# Patient Record
Sex: Female | Born: 1990
Health system: Southern US, Community
[De-identification: ages and names within clinical notes are randomized; demographics above are authoritative.]

## PROBLEM LIST (undated history)

## (undated) DIAGNOSIS — N390 Urinary tract infection, site not specified: Secondary | ICD-10-CM

## (undated) HISTORY — PX: NO PAST SURGERIES: SHX2092

---

## 2011-07-12 NOTE — L&D Delivery Note (Signed)
Delivery Note At 9:08 AM a viable female, "Danielle Cantu", was delivered via Vaginal, Spontaneous Delivery (Presentation: Right Occiput Anterior).  APGAR: 8, 9; weight .   Placenta status: Intact, Spontaneous.  Cord: 3 vessels with the following complications: None.  Cord pH: NA Presented to MAU completely dilated, s/p SROM at 3:30am, clear fluid. Started bleeding at 8am, called office, was instructed to come to MAU.    Anesthesia: Local  Episiotomy: None Lacerations: 2nd degree Suture Repair: 3.0 vicryl Est. Blood Loss (mL): 200 cc  Mom to postpartum.  Baby to nursery-stable.  Nigel Bridgeman 10/11/2011, 9:57 AM

## 2011-09-09 ENCOUNTER — Encounter (INDEPENDENT_AMBULATORY_CARE_PROVIDER_SITE_OTHER): Payer: Self-pay | Admitting: Obstetrics and Gynecology

## 2011-09-09 DIAGNOSIS — Z3480 Encounter for supervision of other normal pregnancy, unspecified trimester: Secondary | ICD-10-CM

## 2011-09-13 ENCOUNTER — Other Ambulatory Visit (INDEPENDENT_AMBULATORY_CARE_PROVIDER_SITE_OTHER): Payer: Self-pay

## 2011-09-13 ENCOUNTER — Encounter: Payer: Self-pay | Admitting: Obstetrics and Gynecology

## 2011-09-13 DIAGNOSIS — O0930 Supervision of pregnancy with insufficient antenatal care, unspecified trimester: Secondary | ICD-10-CM

## 2011-09-19 LAB — ABO/RH

## 2011-09-19 LAB — STREP B DNA PROBE: GBS: NEGATIVE

## 2011-09-19 LAB — RUBELLA ANTIBODY, IGM: Rubella: IMMUNE

## 2011-09-22 ENCOUNTER — Encounter (INDEPENDENT_AMBULATORY_CARE_PROVIDER_SITE_OTHER): Payer: Self-pay | Admitting: Obstetrics and Gynecology

## 2011-09-22 DIAGNOSIS — Z331 Pregnant state, incidental: Secondary | ICD-10-CM

## 2011-09-23 ENCOUNTER — Encounter: Payer: Self-pay | Admitting: Obstetrics and Gynecology

## 2011-09-30 ENCOUNTER — Encounter (INDEPENDENT_AMBULATORY_CARE_PROVIDER_SITE_OTHER): Payer: Self-pay | Admitting: Obstetrics and Gynecology

## 2011-09-30 DIAGNOSIS — Z331 Pregnant state, incidental: Secondary | ICD-10-CM

## 2011-10-05 ENCOUNTER — Encounter (INDEPENDENT_AMBULATORY_CARE_PROVIDER_SITE_OTHER): Payer: Self-pay | Admitting: Obstetrics and Gynecology

## 2011-10-05 DIAGNOSIS — Z331 Pregnant state, incidental: Secondary | ICD-10-CM

## 2011-10-11 ENCOUNTER — Encounter (HOSPITAL_COMMUNITY): Payer: Self-pay | Admitting: *Deleted

## 2011-10-11 ENCOUNTER — Inpatient Hospital Stay (HOSPITAL_COMMUNITY)
Admission: AD | Admit: 2011-10-11 | Discharge: 2011-10-13 | DRG: 373 | Disposition: A | Discharge status: Home / Self Care | Payer: BC Managed Care – PPO | Source: Ambulatory Visit | Attending: Obstetrics and Gynecology | Admitting: Obstetrics and Gynecology

## 2011-10-11 ENCOUNTER — Encounter: Payer: Self-pay | Admitting: Obstetrics and Gynecology

## 2011-10-11 DIAGNOSIS — O09519 Supervision of elderly primigravida, unspecified trimester: Secondary | ICD-10-CM | POA: Diagnosis present

## 2011-10-11 HISTORY — DX: Urinary tract infection, site not specified: N39.0

## 2011-10-11 LAB — CBC
HCT: 38.7 % (ref 36.0–46.0)
MCHC: 34.1 g/dL (ref 30.0–36.0)
MCV: 91.3 fL (ref 78.0–100.0)
Platelets: 191 10*3/uL (ref 150–400)
RDW: 13.8 % (ref 11.5–15.5)

## 2011-10-11 LAB — TYPE AND SCREEN: ABO/RH(D): O POS

## 2011-10-11 MED ORDER — LANOLIN HYDROUS EX OINT: TOPICAL_OINTMENT | CUTANEOUS | Status: DC | PRN

## 2011-10-11 MED ORDER — ONDANSETRON HCL 4 MG PO TABS: 4.0000 mg | ORAL_TABLET | ORAL | Status: DC | PRN

## 2011-10-11 MED ORDER — DIBUCAINE 1 % RE OINT: 1.0000 "application " | TOPICAL_OINTMENT | RECTAL | Status: DC | PRN

## 2011-10-11 MED ORDER — IBUPROFEN 600 MG PO TABS
600.0000 mg | ORAL_TABLET | Freq: Four times a day (QID) | ORAL | Status: DC
  Administered 2011-10-11 – 2011-10-13 (×8): 600 mg via ORAL
  Filled 2011-10-11 (×8): qty 1

## 2011-10-11 MED ORDER — DIPHENHYDRAMINE HCL 25 MG PO CAPS: 25.0000 mg | ORAL_CAPSULE | Freq: Four times a day (QID) | ORAL | Status: DC | PRN

## 2011-10-11 MED ORDER — PRENATAL MULTIVITAMIN CH
1.0000 | ORAL_TABLET | Freq: Every day | ORAL | Status: DC
  Administered 2011-10-12 – 2011-10-13 (×2): 1 via ORAL
  Filled 2011-10-11 (×2): qty 1

## 2011-10-11 MED ORDER — LIDOCAINE HCL (PF) 1 % IJ SOLN
INTRAMUSCULAR | Status: AC
  Administered 2011-10-11: 30 mL
  Filled 2011-10-11: qty 30

## 2011-10-11 MED ORDER — BENZOCAINE-MENTHOL 20-0.5 % EX AERO
INHALATION_SPRAY | CUTANEOUS | Status: AC
  Filled 2011-10-11: qty 56

## 2011-10-11 MED ORDER — ONDANSETRON HCL 4 MG/2ML IJ SOLN: 4.0000 mg | INTRAMUSCULAR | Status: DC | PRN

## 2011-10-11 MED ORDER — LACTATED RINGERS IV SOLN
INTRAVENOUS | Status: DC
  Administered 2011-10-11: 09:00:00 via INTRAVENOUS

## 2011-10-11 MED ORDER — ZOLPIDEM TARTRATE 5 MG PO TABS: 5.0000 mg | ORAL_TABLET | Freq: Every evening | ORAL | Status: DC | PRN

## 2011-10-11 MED ORDER — SENNOSIDES-DOCUSATE SODIUM 8.6-50 MG PO TABS
2.0000 | ORAL_TABLET | Freq: Every day | ORAL | Status: DC
  Administered 2011-10-12: 2 via ORAL

## 2011-10-11 MED ORDER — OXYCODONE-ACETAMINOPHEN 5-325 MG PO TABS
1.0000 | ORAL_TABLET | ORAL | Status: DC | PRN
  Administered 2011-10-11 – 2011-10-12 (×2): 1 via ORAL
  Filled 2011-10-11 (×2): qty 1

## 2011-10-11 MED ORDER — OXYTOCIN 20 UNITS IN LACTATED RINGERS INFUSION - SIMPLE
INTRAVENOUS | Status: AC
  Administered 2011-10-11: 20 [IU]
  Filled 2011-10-11: qty 1000

## 2011-10-11 MED ORDER — BENZOCAINE-MENTHOL 20-0.5 % EX AERO: 1.0000 "application " | INHALATION_SPRAY | CUTANEOUS | Status: DC | PRN

## 2011-10-11 MED ORDER — TETANUS-DIPHTH-ACELL PERTUSSIS 5-2.5-18.5 LF-MCG/0.5 IM SUSP
0.5000 mL | Freq: Once | INTRAMUSCULAR | Status: AC
  Administered 2011-10-12: 0.5 mL via INTRAMUSCULAR
  Filled 2011-10-11: qty 0.5

## 2011-10-11 MED ORDER — SIMETHICONE 80 MG PO CHEW: 80.0000 mg | CHEWABLE_TABLET | ORAL | Status: DC | PRN

## 2011-10-11 MED ORDER — ERYTHROMYCIN 5 MG/GM OP OINT
TOPICAL_OINTMENT | OPHTHALMIC | Status: AC
  Filled 2011-10-11: qty 1

## 2011-10-11 MED ORDER — WITCH HAZEL-GLYCERIN EX PADS: 1.0000 "application " | MEDICATED_PAD | CUTANEOUS | Status: DC | PRN

## 2011-10-11 NOTE — H&P (Signed)
Danielle Cantu is a 21 y.o. female, G1P0 at 36 weeks presenting with report of SROM at 3:30am, onset of contractions soon after, and onset of vaginal bleeding at approx 8:15 am.  Patient's husband called the office and was directed to bring the patient here.  She was noted to be completely dilated on arrival in MAU and delivered soon after arrival.  Pregnancy remarkable for: Late to care, with onset of care at [facility] at 68 weeks--unsure degree of care in the [place] before transfer to [facility] Advanced paternal age  History of present pregnancy: Patient entered care at 34 weeks at [facility], with unclear amount of care before that when patient was in the [place].  EDC of 10/18/11 was established by LMP.  Her first ultrasound was done at 35-36 week, with normal findings.  Her prenatal course prior to her arrival in the [place] was essentially uncomplicated per her report.  Her NOB labs were done at her first visit at [facility] on 09/09/11.  No significant risk issues were identified after her arrival, other than being late to care.  History OB History    Grav Para Term Preterm Abortions TAB SAB Ect Mult Living   1 1 0 0 0 0 0 0 0 1      Past Medical History  Diagnosis Date  . Urinary tract infection    Past Surgical History  Procedure Date  . No past surgeries    Family History: family history is negative for Anesthesia problems. Social History:  reports that she has never smoked. She has never used smokeless tobacco. She reports that she does not drink alcohol or use illicit drugs. FOB is involved and supportive.  Patient is [place], does speak [place].  ROS:  Contractions, vaginal bleeding, leaking of clear fluid.  Dilation: 10 Exam by:: Emilee Hero. Blood pressure 98/64, pulse 91, temperature 98.3 F (36.8 C), temperature source Oral, resp. rate 18, SpO2 98.00%, unknown if currently breastfeeding.  Exam Physical Exam  Chest clear Heart RRR without murmur Abd gravid, NT between  contractions Pelvic--see above, vtx at +2 station, moderate vaginal bleeding, consistent with bloody show FHR reassuring, mild/moderate variables with pushes UC q 3 minutes, strong.  Prenatal labs: ABO, Rh: --/--/O POS, O POS (04/02 0855) Antibody: NEG (04/02 0855) Rubella:  Immune RPR: NON REACTIVE (04/02 0855)  HBsAg:   Negative HIV:   NG GBS:   Negative Sickle cell negative Cultures negative at 35-36 weeks  Assessment/Plan: IUP at 39 weeks Second stage labor, delivery imminent  Will deliver in MAU.  Danielle Cantu 10/11/2011, 10am

## 2011-10-11 NOTE — MAU Note (Signed)
asst to rm via wc.  Reports srom at 0330. Bleeding started 0800.  Bright red blood noted on towel.  Constant pain , lower abd and low back

## 2011-10-12 LAB — CBC
HCT: 33.1 % — ABNORMAL LOW (ref 36.0–46.0)
Hemoglobin: 11 g/dL — ABNORMAL LOW (ref 12.0–15.0)
MCH: 30.6 pg (ref 26.0–34.0)
MCHC: 33.2 g/dL (ref 30.0–36.0)
MCV: 92.2 fL (ref 78.0–100.0)

## 2011-10-12 NOTE — Progress Notes (Signed)
Post Partum Day 1 Subjective: no complaints, up ad lib, voiding, tolerating PO and breastfeeding.  Pt undecided about birth control PP.  Objective: Blood pressure 100/64, pulse 97, temperature 98 F (36.7 C), temperature source Oral, resp. rate 19, last menstrual period 01/11/2011, SpO2 98.00%, unknown if currently breastfeeding.  Physical Exam:  General: alert and no distress Lochia: appropriate Uterine Fundus: firm, NT Incision: n/a DVT Evaluation: No evidence of DVT seen on physical exam. No cords or calf tenderness. No significant calf/ankle edema.   Basename 10/12/11 0600 10/11/11 0855  HGB 11.0* 13.2  HCT 33.1* 38.7    Assessment/Plan: Plan for discharge tomorrow, Breastfeeding, Circumcision prior to discharge (done 10/11/11) and Contraception undecided. Continue Current Care.   LOS: 1 day   Shauntel Prest Y 10/12/2011, 5:43 PM

## 2011-10-12 NOTE — Progress Notes (Signed)
Clinical Social Work Department PSYCHOSOCIAL ASSESSMENT - MATERNAL/CHILD 10/12/2011  Patient:  Danielle Cantu,Danielle Cantu  Account Number:  [id number]  Admit Date:  10/11/2011  Childs Name:   Danielle Cantu    Clinical Social Worker:  Nikeya Maxim, LCSWA   Date/Time:  10/12/2011 11:00 AM  Date Referred:  10/12/2011   Referral source  CN     Referred reason  LPNC   Other referral source:    I:  FAMILY / HOME ENVIRONMENT Child's legal guardian:  PARENT  Guardian - Name Guardian - Age Guardian - Address  Danielle Cantu 20 [address] [place], [place] [address]  Danielle Cantu 52 (same as above)   Other household support members/support persons Other support:    II  PSYCHOSOCIAL DATA Information Source:  Patient Interview  Financial and Community Resources Employment:   Financial resources:  Private Insurance If Medicaid - County:    School / Grade:   Maternity Care Coordinator / Child Services Coordination / Early Interventions:  Cultural issues impacting care:    III  STRENGTHS Strengths  Adequate Resources  Home prepared for Child (including basic supplies)  Supportive family/friends   Strength comment:    IV  RISK FACTORS AND CURRENT PROBLEMS Current Problem:  None   Risk Factor & Current Problem Patient Issue Family Issue Risk Factor / Current Problem Comment   N N LPNC    V  SOCIAL WORK ASSESSMENT Pt started PNC, Oct. 6 while in the [place].  She received regular PNC until she relocated to the area 08/16/11.  Pt then established care locally.  She denies any illegal substance use.  UDS is negative, meconium results are pending.  She lives with her husband, who she describes as supportive.  Pt is bonding well with the infant and appears appropriate.  Sw will follow up with drug screen results and make a referral if needed.      VI SOCIAL WORK PLAN Social Work Plan  No Further Intervention Required / No Barriers to Discharge   Type of pt/family education:   If child  protective services report - county:   If child protective services report - date:   Information/referral to community resources comment:   Other social work plan:    

## 2011-10-13 MED ORDER — IBUPROFEN 600 MG PO TABS: 600.0000 mg | ORAL_TABLET | Freq: Four times a day (QID) | ORAL | Status: AC

## 2011-10-13 MED ORDER — OXYCODONE-ACETAMINOPHEN 5-325 MG PO TABS: 1.0000 | ORAL_TABLET | ORAL | Status: AC | PRN

## 2011-10-13 MED ORDER — FERROUS SULFATE 325 (65 FE) MG PO TABS: 325.0000 mg | ORAL_TABLET | Freq: Three times a day (TID) | ORAL | Status: AC

## 2011-10-13 NOTE — Discharge Instructions (Signed)

## 2011-10-13 NOTE — Discharge Summary (Signed)
Physician Discharge Summary  Patient ID: Danielle Cantu MRN: [id number] DOB/AGE: 21-20-1992 20 y.o.  PrPregnancy remarkable for:  Late to care, with onset of care at [facility] at 26 weeks--unsure degree of care in the [place] before transfer to [facility]  Advanced paternal age oblem list:  Admit date: 10/11/2011 Discharge date: 10/13/2011  Admission Diagnoses: term pg, 2nd stage labor  Discharge Diagnoses: SVD, normal involution, 2 degree laceration,  Active Problems:  * No active hospital problems. *  vaginal delivery  Discharged Condition: stable  Hospital Course: normal involution Consults: None  Significant Diagnostic Studies: labs:  Hemoglobin & Hematocrit     Component Value Date/Time   HGB 11.0* 10/12/2011 0600   HCT 33.1* 10/12/2011 0600     Treatments: none  Discharge Exam: Blood pressure 93/61, pulse 64, temperature 97.9 F (36.6 C), temperature source Oral, resp. rate 18, last menstrual period 01/11/2011, SpO2 98.00%, unknown if currently breastfeeding. see Dr. Stefano Gaul progress note  Disposition: Final discharge disposition not confirmed  Discharge Orders    Future Orders Please Complete By Expires   Strep B DNA probe      Comments:   This external order was created through the Results Console.   HIV antibody      Comments:   This external order was created through the Results Console.   GC/chlamydia probe amp, genital      Comments:   This external order was created through the Results Console.   Hepatitis B surface antigen      Comments:   This external order was created through the Results Console.   Rubella antibody, IgM      Comments:   This external order was created through the Results Console.   ABO/Rh      Comments:   This external order was created through the Results Console.     Medication List  As of 10/13/2011 10:32 AM   TAKE these medications         ferrous sulfate 325 (65 FE) MG tablet   Take 1 tablet (325 mg total) by mouth 3 (three) times  daily with meals.      fish oil-omega-3 fatty acids 1000 MG capsule   Take 1 g by mouth daily.      ibuprofen 600 MG tablet   Commonly known as: ADVIL,MOTRIN   Take 1 tablet (600 mg total) by mouth every 6 (six) hours.      oxyCODONE-acetaminophen 5-325 MG per tablet   Commonly known as: PERCOCET   Take 1-2 tablets by mouth every 3 (three) hours as needed (moderate - severe pain).      prenatal multivitamin Tabs   Take 1 tablet by mouth daily.           Follow-up Information    Follow up with [facility] in 6 weeks.       discharge by Dr. Stefano Gaul, undecided regarding contraception.  SignedLavera Guise 10/13/2011, 10:32 AM

## 2011-10-13 NOTE — Progress Notes (Signed)
Post Partum Day 2 Subjective: no complaints, up ad lib, voiding and tolerating PO  Objective: Blood pressure 93/61, pulse 64, temperature 97.9 F (36.6 C), temperature source Oral, resp. rate 18, last menstrual period 01/11/2011, SpO2 98.00%, unknown if currently breastfeeding.  Physical Exam:  General: alert and cooperative Lochia: appropriate Uterine Fundus: firm Incision: NA DVT Evaluation: No evidence of DVT seen on physical exam. Negative Homan's sign.   Basename 10/12/11 0600 10/11/11 0855  HGB 11.0* 13.2  HCT 33.1* 38.7    Assessment/Plan: Discharge home, Breastfeeding and Contraception Undecided.   LOS: 2 days   Lennan Malone V 10/13/2011, 9:25 AM

## 2011-11-22 ENCOUNTER — Ambulatory Visit (INDEPENDENT_AMBULATORY_CARE_PROVIDER_SITE_OTHER): Payer: Self-pay | Admitting: Obstetrics and Gynecology

## 2011-11-22 ENCOUNTER — Encounter: Payer: Self-pay | Admitting: Obstetrics and Gynecology

## 2011-11-22 VITALS — BP 90/56 | Resp 12 | Wt 102.0 lb

## 2011-11-22 DIAGNOSIS — N898 Other specified noninflammatory disorders of vagina: Secondary | ICD-10-CM

## 2011-11-22 LAB — POCT WET PREP (WET MOUNT)

## 2011-11-22 MED ORDER — TERCONAZOLE 0.4 % VA CREA: 1.0000 | TOPICAL_CREAM | Freq: Every day | VAGINAL | Status: AC

## 2011-11-22 NOTE — Progress Notes (Signed)
S: comfortable      Bleeding stopped    breast feeding wants to stop but breasts hurt if not pumping  O VSS     abd soft, nontender     Diastasis recti finger breath     Normal hair distrubition mons pubis,      EGBUS and perineum WNL,  vagina  pink, moist normal rugae, good vaginal tone cerix LTC, no cervical motion tenderness, No adnexal masses or tenderness uterus firm small Wet prep neg clue, neg trich, +hyphae     A normal involution     Lactating     6 weeks PP VVC P f/o up annual and prn     No birth control plans, RX terazol discussed,baking soda bathes, info on gardasil given Lavera Guise, CNM

## 2011-11-22 NOTE — Progress Notes (Signed)
Date of delivery: 10/11/2011 Female Name: Domini Vaginal delivery:yes Cesarean section:no Tubal ligation:no GDM:no Breast Feeding:yes Bottle Feeding:yes b millk Post-Partum Blues:no Abnormal pap:no Normal GU function: yes Normal GI function:yes Returning to work:no  C/o L breast very painful & twice the size as the R breast  EPS 4 - husband completed form for pt

## 2011-11-23 ENCOUNTER — Other Ambulatory Visit: Payer: Self-pay | Admitting: Obstetrics and Gynecology

## 2011-11-23 NOTE — Telephone Encounter (Signed)
PT'S HUSBAND CALLED, STATES A RX WAS TO BE SENT TO PHARM FOR PT AND IT WAS NOT THERE, PER STEPHANIE, PHARMACIST @ [facility], THEY HAVE NOT GOTTEN A RX FOR THIS PT.  RX CALLED IN FOR TERAZOL 7, PT MADE AWARE.

## 2011-11-23 NOTE — Telephone Encounter (Signed)
Triage/epic/was seen by Judith Part

## 2012-03-21 ENCOUNTER — Encounter: Payer: Self-pay | Admitting: Obstetrics and Gynecology

## 2012-03-21 ENCOUNTER — Ambulatory Visit (INDEPENDENT_AMBULATORY_CARE_PROVIDER_SITE_OTHER): Payer: BC Managed Care – PPO | Admitting: Obstetrics and Gynecology

## 2012-03-21 VITALS — BP 100/62 | HR 68 | Resp 16 | Ht 60.0 in | Wt 97.0 lb

## 2012-03-21 DIAGNOSIS — Z124 Encounter for screening for malignant neoplasm of cervix: Secondary | ICD-10-CM

## 2012-03-21 DIAGNOSIS — Z23 Encounter for immunization: Secondary | ICD-10-CM

## 2012-03-21 MED ORDER — HPV QUADRIVALENT VACCINE IM SUSP
0.5000 mL | Freq: Once | INTRAMUSCULAR | Status: AC
  Administered 2012-03-21: 0.5 mL via INTRAMUSCULAR
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
## 2012-03-21 NOTE — Addendum Note (Signed)
Addended by: Stephens Shire on: 03/21/2012 04:09 PM   Modules accepted: Orders

## 2012-03-21 NOTE — Progress Notes (Addendum)
Gardasil Immunization was documented by [facility]

## 2012-03-21 NOTE — Addendum Note (Signed)
Addended by: Mathis Bud on: 03/21/2012 05:46 PM   Modules accepted: Orders

## 2012-03-22 LAB — PAP IG W/ RFLX HPV ASCU

## 2012-05-17 ENCOUNTER — Other Ambulatory Visit (INDEPENDENT_AMBULATORY_CARE_PROVIDER_SITE_OTHER): Payer: BC Managed Care – PPO

## 2012-05-17 DIAGNOSIS — Z23 Encounter for immunization: Secondary | ICD-10-CM

## 2012-05-17 MED ORDER — HPV QUADRIVALENT VACCINE IM SUSP
0.5000 mL | Freq: Once | INTRAMUSCULAR | Status: AC
  Administered 2012-05-17 – 2012-05-18 (×2): 0.5 mL via INTRAMUSCULAR

## 2012-08-29 ENCOUNTER — Other Ambulatory Visit: Payer: BC Managed Care – PPO

## 2012-08-29 DIAGNOSIS — Z23 Encounter for immunization: Secondary | ICD-10-CM

## 2014-05-12 ENCOUNTER — Encounter: Payer: Self-pay | Admitting: Obstetrics and Gynecology

## 2014-05-23 ENCOUNTER — Emergency Department (HOSPITAL_COMMUNITY): Payer: BC Managed Care – PPO

## 2014-05-23 ENCOUNTER — Emergency Department (HOSPITAL_COMMUNITY)
Admission: EM | Admit: 2014-05-23 | Discharge: 2014-05-23 | Disposition: A | Discharge status: Home / Self Care | Payer: BC Managed Care – PPO | Attending: Emergency Medicine | Admitting: Emergency Medicine

## 2014-05-23 ENCOUNTER — Encounter (HOSPITAL_COMMUNITY): Payer: Self-pay | Admitting: *Deleted

## 2014-05-23 DIAGNOSIS — Y9389 Activity, other specified: Secondary | ICD-10-CM | POA: Diagnosis not present

## 2014-05-23 DIAGNOSIS — Z87440 Personal history of urinary (tract) infections: Secondary | ICD-10-CM | POA: Insufficient documentation

## 2014-05-23 DIAGNOSIS — S199XXA Unspecified injury of neck, initial encounter: Secondary | ICD-10-CM | POA: Diagnosis not present

## 2014-05-23 DIAGNOSIS — Y9289 Other specified places as the place of occurrence of the external cause: Secondary | ICD-10-CM | POA: Diagnosis not present

## 2014-05-23 DIAGNOSIS — S0990XA Unspecified injury of head, initial encounter: Secondary | ICD-10-CM | POA: Insufficient documentation

## 2014-05-23 DIAGNOSIS — Z79899 Other long term (current) drug therapy: Secondary | ICD-10-CM | POA: Diagnosis not present

## 2014-05-23 DIAGNOSIS — S3992XA Unspecified injury of lower back, initial encounter: Secondary | ICD-10-CM | POA: Insufficient documentation

## 2014-05-23 DIAGNOSIS — T148XXA Other injury of unspecified body region, initial encounter: Secondary | ICD-10-CM

## 2014-05-23 DIAGNOSIS — S0081XA Abrasion of other part of head, initial encounter: Secondary | ICD-10-CM | POA: Diagnosis not present

## 2014-05-23 DIAGNOSIS — Y998 Other external cause status: Secondary | ICD-10-CM | POA: Insufficient documentation

## 2014-05-23 DIAGNOSIS — S0083XA Contusion of other part of head, initial encounter: Secondary | ICD-10-CM | POA: Insufficient documentation

## 2014-05-23 LAB — POC URINE PREG, ED: PREG TEST UR: NEGATIVE

## 2014-05-23 MED ORDER — HYDROCODONE-ACETAMINOPHEN 5-325 MG PO TABS
1.0000 | ORAL_TABLET | Freq: Once | ORAL | Status: AC
  Administered 2014-05-23: 1 via ORAL
  Filled 2014-05-23: qty 1

## 2014-05-23 MED ORDER — HYDROCODONE-ACETAMINOPHEN 5-325 MG PO TABS: 1.0000 | ORAL_TABLET | Freq: Four times a day (QID) | ORAL | Status: AC | PRN
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
## 2014-05-23 NOTE — ED Notes (Signed)
Patient transported to CT 
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
## 2016-07-08 DIAGNOSIS — J069 Acute upper respiratory infection, unspecified: Secondary | ICD-10-CM | POA: Diagnosis not present

## 2016-07-08 DIAGNOSIS — R05 Cough: Secondary | ICD-10-CM | POA: Diagnosis not present

## 2017-04-06 DIAGNOSIS — F331 Major depressive disorder, recurrent, moderate: Secondary | ICD-10-CM | POA: Diagnosis not present

## 2017-04-24 ENCOUNTER — Other Ambulatory Visit: Payer: Self-pay | Admitting: Family Medicine

## 2017-04-24 ENCOUNTER — Other Ambulatory Visit (HOSPITAL_COMMUNITY)
Admission: RE | Admit: 2017-04-24 | Discharge: 2017-04-24 | Disposition: A | Discharge status: Home / Self Care | Payer: BLUE CROSS/BLUE SHIELD | Source: Ambulatory Visit | Attending: Family Medicine | Admitting: Family Medicine

## 2017-04-24 DIAGNOSIS — Z01419 Encounter for gynecological examination (general) (routine) without abnormal findings: Secondary | ICD-10-CM | POA: Diagnosis not present

## 2017-04-24 DIAGNOSIS — R209 Unspecified disturbances of skin sensation: Secondary | ICD-10-CM | POA: Diagnosis not present

## 2017-04-24 DIAGNOSIS — Z0000 Encounter for general adult medical examination without abnormal findings: Secondary | ICD-10-CM | POA: Diagnosis not present

## 2017-04-24 DIAGNOSIS — Z13220 Encounter for screening for lipoid disorders: Secondary | ICD-10-CM | POA: Diagnosis not present

## 2017-04-24 DIAGNOSIS — Z124 Encounter for screening for malignant neoplasm of cervix: Secondary | ICD-10-CM | POA: Diagnosis not present

## 2017-04-25 ENCOUNTER — Other Ambulatory Visit: Payer: Self-pay | Admitting: Family Medicine

## 2017-04-25 DIAGNOSIS — IMO0001 Reserved for inherently not codable concepts without codable children: Secondary | ICD-10-CM

## 2017-04-25 DIAGNOSIS — R209 Unspecified disturbances of skin sensation: Principal | ICD-10-CM

## 2017-04-25 LAB — CYTOLOGY - PAP: DIAGNOSIS: NEGATIVE

## 2017-04-27 DIAGNOSIS — F331 Major depressive disorder, recurrent, moderate: Secondary | ICD-10-CM | POA: Diagnosis not present

## 2017-05-04 DIAGNOSIS — F331 Major depressive disorder, recurrent, moderate: Secondary | ICD-10-CM | POA: Diagnosis not present

## 2017-06-27 DIAGNOSIS — F331 Major depressive disorder, recurrent, moderate: Secondary | ICD-10-CM | POA: Diagnosis not present

## 2018-01-25 DIAGNOSIS — Z124 Encounter for screening for malignant neoplasm of cervix: Secondary | ICD-10-CM | POA: Diagnosis not present

## 2018-03-08 DIAGNOSIS — R05 Cough: Secondary | ICD-10-CM | POA: Diagnosis not present

## 2018-03-08 DIAGNOSIS — R062 Wheezing: Secondary | ICD-10-CM | POA: Diagnosis not present

## 2018-04-25 ENCOUNTER — Other Ambulatory Visit: Payer: Self-pay | Admitting: Family Medicine

## 2018-04-25 ENCOUNTER — Other Ambulatory Visit (HOSPITAL_COMMUNITY)
Admission: RE | Admit: 2018-04-25 | Discharge: 2018-04-25 | Disposition: A | Discharge status: Home / Self Care | Payer: BLUE CROSS/BLUE SHIELD | Source: Ambulatory Visit | Attending: Family Medicine | Admitting: Family Medicine

## 2018-04-25 DIAGNOSIS — Z01419 Encounter for gynecological examination (general) (routine) without abnormal findings: Secondary | ICD-10-CM | POA: Diagnosis not present

## 2018-04-25 DIAGNOSIS — Z0000 Encounter for general adult medical examination without abnormal findings: Secondary | ICD-10-CM | POA: Diagnosis not present

## 2018-04-25 DIAGNOSIS — Z13220 Encounter for screening for lipoid disorders: Secondary | ICD-10-CM | POA: Diagnosis not present

## 2018-04-25 DIAGNOSIS — Z23 Encounter for immunization: Secondary | ICD-10-CM | POA: Diagnosis not present

## 2018-04-26 LAB — CYTOLOGY - PAP: Diagnosis: NEGATIVE

## 2019-05-01 ENCOUNTER — Other Ambulatory Visit (HOSPITAL_COMMUNITY)
Admission: RE | Admit: 2019-05-01 | Discharge: 2019-05-01 | Disposition: A | Discharge status: Home / Self Care | Payer: BC Managed Care – PPO | Source: Ambulatory Visit | Attending: Family Medicine | Admitting: Family Medicine

## 2019-05-01 ENCOUNTER — Other Ambulatory Visit: Payer: Self-pay | Admitting: Family Medicine

## 2019-05-01 DIAGNOSIS — Z124 Encounter for screening for malignant neoplasm of cervix: Secondary | ICD-10-CM | POA: Diagnosis not present

## 2019-05-01 DIAGNOSIS — Z0000 Encounter for general adult medical examination without abnormal findings: Secondary | ICD-10-CM | POA: Diagnosis not present

## 2019-05-01 DIAGNOSIS — Z23 Encounter for immunization: Secondary | ICD-10-CM | POA: Diagnosis not present

## 2019-05-01 DIAGNOSIS — Z13220 Encounter for screening for lipoid disorders: Secondary | ICD-10-CM | POA: Diagnosis not present

## 2019-05-06 LAB — CYTOLOGY - PAP: Diagnosis: NEGATIVE

## 2019-11-01 DIAGNOSIS — J019 Acute sinusitis, unspecified: Secondary | ICD-10-CM | POA: Diagnosis not present

## 2019-11-01 DIAGNOSIS — Z20822 Contact with and (suspected) exposure to covid-19: Secondary | ICD-10-CM | POA: Diagnosis not present

## 2020-05-06 ENCOUNTER — Other Ambulatory Visit (HOSPITAL_COMMUNITY)
Admission: RE | Admit: 2020-05-06 | Discharge: 2020-05-06 | Disposition: A | Discharge status: Home / Self Care | Payer: BC Managed Care – PPO | Source: Ambulatory Visit | Attending: Family Medicine | Admitting: Family Medicine

## 2020-05-06 ENCOUNTER — Other Ambulatory Visit: Payer: Self-pay | Admitting: Family Medicine

## 2020-05-06 DIAGNOSIS — Z13220 Encounter for screening for lipoid disorders: Secondary | ICD-10-CM | POA: Diagnosis not present

## 2020-05-06 DIAGNOSIS — Z0000 Encounter for general adult medical examination without abnormal findings: Secondary | ICD-10-CM | POA: Diagnosis not present

## 2020-05-13 LAB — CYTOLOGY - PAP
Comment: NEGATIVE
High risk HPV: POSITIVE — AB

## 2020-06-09 DIAGNOSIS — R87612 Low grade squamous intraepithelial lesion on cytologic smear of cervix (LGSIL): Secondary | ICD-10-CM | POA: Diagnosis not present

## 2020-06-10 ENCOUNTER — Other Ambulatory Visit: Payer: Self-pay | Admitting: Obstetrics and Gynecology

## 2020-06-10 DIAGNOSIS — N72 Inflammatory disease of cervix uteri: Secondary | ICD-10-CM | POA: Diagnosis not present

## 2020-06-10 DIAGNOSIS — N870 Mild cervical dysplasia: Secondary | ICD-10-CM | POA: Diagnosis not present

## 2020-06-19 DIAGNOSIS — N870 Mild cervical dysplasia: Secondary | ICD-10-CM | POA: Diagnosis not present

## 2021-05-10 DIAGNOSIS — Z0000 Encounter for general adult medical examination without abnormal findings: Secondary | ICD-10-CM | POA: Diagnosis not present

## 2021-05-10 DIAGNOSIS — Z13220 Encounter for screening for lipoid disorders: Secondary | ICD-10-CM | POA: Diagnosis not present

## 2021-05-10 DIAGNOSIS — Z23 Encounter for immunization: Secondary | ICD-10-CM | POA: Diagnosis not present

## 2021-07-28 DIAGNOSIS — N870 Mild cervical dysplasia: Secondary | ICD-10-CM | POA: Diagnosis not present

## 2021-07-28 DIAGNOSIS — Z6823 Body mass index (BMI) 23.0-23.9, adult: Secondary | ICD-10-CM | POA: Diagnosis not present

## 2021-07-28 DIAGNOSIS — Z01419 Encounter for gynecological examination (general) (routine) without abnormal findings: Secondary | ICD-10-CM | POA: Diagnosis not present

## 2021-07-28 DIAGNOSIS — Z3040 Encounter for surveillance of contraceptives, unspecified: Secondary | ICD-10-CM | POA: Diagnosis not present

## 2022-05-25 DIAGNOSIS — Z13220 Encounter for screening for lipoid disorders: Secondary | ICD-10-CM | POA: Diagnosis not present

## 2022-05-25 DIAGNOSIS — Z23 Encounter for immunization: Secondary | ICD-10-CM | POA: Diagnosis not present

## 2022-05-25 DIAGNOSIS — Z0000 Encounter for general adult medical examination without abnormal findings: Secondary | ICD-10-CM | POA: Diagnosis not present
